# Patient Record
Sex: Male | Born: 1995 | Race: Black or African American | Hispanic: No | Marital: Single | Smoking: Never smoker
Health system: Southern US, Community
[De-identification: ages and names within clinical notes are randomized; demographics above are authoritative.]

---
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
## 2017-10-06 ENCOUNTER — Emergency Department (HOSPITAL_COMMUNITY)
Admission: EM | Admit: 2017-10-06 | Discharge: 2017-10-06 | Disposition: A | Discharge status: Home / Self Care | Payer: No Typology Code available for payment source | Attending: Emergency Medicine | Admitting: Emergency Medicine

## 2017-10-06 ENCOUNTER — Other Ambulatory Visit: Payer: Self-pay

## 2017-10-06 ENCOUNTER — Encounter (HOSPITAL_COMMUNITY): Payer: Self-pay | Admitting: Emergency Medicine

## 2017-10-06 ENCOUNTER — Emergency Department (HOSPITAL_COMMUNITY): Payer: No Typology Code available for payment source

## 2017-10-06 DIAGNOSIS — Y92411 Interstate highway as the place of occurrence of the external cause: Secondary | ICD-10-CM | POA: Insufficient documentation

## 2017-10-06 DIAGNOSIS — M542 Cervicalgia: Secondary | ICD-10-CM | POA: Insufficient documentation

## 2017-10-06 DIAGNOSIS — M25511 Pain in right shoulder: Secondary | ICD-10-CM | POA: Diagnosis not present

## 2017-10-06 DIAGNOSIS — Y9389 Activity, other specified: Secondary | ICD-10-CM | POA: Insufficient documentation

## 2017-10-06 DIAGNOSIS — Y999 Unspecified external cause status: Secondary | ICD-10-CM | POA: Diagnosis not present

## 2017-10-06 MED ORDER — CYCLOBENZAPRINE HCL 10 MG PO TABS
10.0000 mg | ORAL_TABLET | Freq: Once | ORAL | Status: AC
  Administered 2017-10-06: 10 mg via ORAL
  Filled 2017-10-06: qty 1

## 2017-10-06 MED ORDER — CYCLOBENZAPRINE HCL 10 MG PO TABS: 10.0000 mg | ORAL_TABLET | Freq: Every evening | ORAL | 0 refills | Status: DC | PRN

## 2017-10-06 MED ORDER — ACETAMINOPHEN 500 MG PO TABS
1000.0000 mg | ORAL_TABLET | Freq: Once | ORAL | Status: AC
  Administered 2017-10-06: 1000 mg via ORAL
  Filled 2017-10-06: qty 2
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
## 2017-10-06 NOTE — Discharge Instructions (Signed)
Today you received medications that may make you sleepy or impair your ability to make decisions.  For the next 24 hours please do not drive, operate heavy machinery, care for a small child with out another adult present, or perform any activities that may cause harm to you or someone else if you were to fall asleep or be impaired.  ° °You are being prescribed a medication which may make you sleepy. Please follow up of listed precautions for at least 24 hours after taking one dose. ° °Please take Ibuprofen (Advil, motrin) and Tylenol (acetaminophen) to relieve your pain.  You may take up to 600 MG (3 pills) of normal strength ibuprofen every 8 hours as needed.  In between doses of ibuprofen you make take tylenol, up to 1,000 mg (two extra strength pills).  Do not take more than 3,000 mg tylenol in a 24 hour period.  Please check all medication labels as many medications such as pain and cold medications may contain tylenol.  Do not drink alcohol while taking these medications.  Do not take other NSAID'S while taking ibuprofen (such as aleve or naproxen).  Please take ibuprofen with food to decrease stomach upset. ° °The best way to get rid of muscle pain is by taking NSAIDS, using heat, massage therapy, and gentle stretching/range of motion exercises. ° °

## 2019-01-29 ENCOUNTER — Other Ambulatory Visit: Payer: Self-pay

## 2019-01-29 ENCOUNTER — Emergency Department (HOSPITAL_COMMUNITY)
Admission: EM | Admit: 2019-01-29 | Discharge: 2019-01-30 | Disposition: A | Discharge status: Home / Self Care | Payer: BLUE CROSS/BLUE SHIELD | Attending: Emergency Medicine | Admitting: Emergency Medicine

## 2019-01-29 ENCOUNTER — Emergency Department (HOSPITAL_COMMUNITY): Payer: BLUE CROSS/BLUE SHIELD

## 2019-01-29 ENCOUNTER — Encounter (HOSPITAL_COMMUNITY): Payer: Self-pay

## 2019-01-29 DIAGNOSIS — Z79899 Other long term (current) drug therapy: Secondary | ICD-10-CM | POA: Insufficient documentation

## 2019-01-29 DIAGNOSIS — R42 Dizziness and giddiness: Secondary | ICD-10-CM | POA: Insufficient documentation

## 2019-01-29 DIAGNOSIS — R531 Weakness: Secondary | ICD-10-CM

## 2019-01-29 DIAGNOSIS — R5383 Other fatigue: Secondary | ICD-10-CM | POA: Diagnosis not present

## 2019-01-29 DIAGNOSIS — R0789 Other chest pain: Secondary | ICD-10-CM | POA: Diagnosis not present

## 2019-01-29 DIAGNOSIS — R55 Syncope and collapse: Secondary | ICD-10-CM

## 2019-01-29 LAB — URINALYSIS, ROUTINE W REFLEX MICROSCOPIC
Bilirubin Urine: NEGATIVE
GLUCOSE, UA: NEGATIVE mg/dL
HGB URINE DIPSTICK: NEGATIVE
Ketones, ur: NEGATIVE mg/dL
LEUKOCYTE UA: NEGATIVE
Nitrite: NEGATIVE
PH: 7 (ref 5.0–8.0)
PROTEIN: NEGATIVE mg/dL
SPECIFIC GRAVITY, URINE: 1.019 (ref 1.005–1.030)

## 2019-01-29 LAB — CBC
HEMATOCRIT: 47.1 % (ref 39.0–52.0)
HEMOGLOBIN: 14.8 g/dL (ref 13.0–17.0)
MCH: 28 pg (ref 26.0–34.0)
MCHC: 31.4 g/dL (ref 30.0–36.0)
MCV: 89.2 fL (ref 80.0–100.0)
NRBC: 0 % (ref 0.0–0.2)
Platelets: 271 10*3/uL (ref 150–400)
RBC: 5.28 MIL/uL (ref 4.22–5.81)
RDW: 12.6 % (ref 11.5–15.5)
WBC: 5.9 10*3/uL (ref 4.0–10.5)

## 2019-01-29 LAB — BASIC METABOLIC PANEL
ANION GAP: 9 (ref 5–15)
BUN: 13 mg/dL (ref 6–20)
CO2: 27 mmol/L (ref 22–32)
Calcium: 9.5 mg/dL (ref 8.9–10.3)
Chloride: 102 mmol/L (ref 98–111)
Creatinine, Ser: 0.94 mg/dL (ref 0.61–1.24)
Glucose, Bld: 86 mg/dL (ref 70–99)
POTASSIUM: 3.7 mmol/L (ref 3.5–5.1)
SODIUM: 138 mmol/L (ref 135–145)

## 2019-01-29 LAB — I-STAT TROPONIN, ED: Troponin i, poc: 0 ng/mL (ref 0.00–0.08)

## 2019-01-29 MED ORDER — SODIUM CHLORIDE 0.9 % IV BOLUS
1000.0000 mL | Freq: Once | INTRAVENOUS | Status: AC
  Administered 2019-01-29: 1000 mL via INTRAVENOUS

## 2019-01-29 MED ORDER — KETOROLAC TROMETHAMINE 30 MG/ML IJ SOLN
30.0000 mg | Freq: Once | INTRAMUSCULAR | Status: AC
  Administered 2019-01-29: 30 mg via INTRAVENOUS
  Filled 2019-01-29: qty 1

## 2019-01-29 MED ORDER — ONDANSETRON HCL 4 MG/2ML IJ SOLN
4.0000 mg | Freq: Once | INTRAMUSCULAR | Status: AC
  Administered 2019-01-30: 4 mg via INTRAVENOUS
  Filled 2019-01-29: qty 2

## 2019-01-29 NOTE — ED Notes (Signed)
Patient transported to X-ray 

## 2019-01-29 NOTE — ED Provider Notes (Signed)
[facility]-EMERGENCY DEPT Provider Note   CSN: [id number] Arrival date & time: 01/29/19  1825    History   Chief Complaint Chief Complaint  Patient presents with  . Dizziness  . Gastroesophageal Reflux    HPI Fred Hudson is a 23 y.o. male.     Fred Hudson is a 23 y.o. male who is otherwise healthy, presents to the emergency department for evaluation of feeling lightheaded, dizzy and fatigued.  Reports that he started feeling this way earlier this afternoon after he left work.  He reports that after returning home from work he felt lightheaded and almost passed out but was able to catch himself.  He reports feeling shaky, generally fatigued and weak.  He has not had any fevers.  Reports he has had some intermittent sharp chest pains but no associated shortness of breath.  Pain is not worse with exertion and is not pleuritic in nature.  He does report some associated issues with acid reflux and has had some intermittent burping and indigestion.  No associated fevers, cough or congestion.  No abdominal pain, nausea or vomiting, no diarrhea.  No one else at home with similar symptoms.  He has never had symptoms prior to this before.  Patient does report that all he has had to eat all day is a small cup of noodles and he has had very little to drink and wonders if this may contribute.  Patient's dad is at bedside and denies any family history of heart issues or sudden cardiac death, patient has not had any issues with arrhythmia or other cardiac abnormalities.     History reviewed. No pertinent past medical history.  There are no active problems to display for this patient.   History reviewed. No pertinent surgical history.      Home Medications    Prior to Admission medications   Medication Sig Start Date End Date Taking? Authorizing Provider  acetaminophen (TYLENOL) 500 MG tablet Take 1,000 mg by mouth every 6 (six) hours as needed for moderate pain.   Yes  [provider]  cyclobenzaprine (FLEXERIL) 10 MG tablet Take 1 tablet (10 mg total) at bedtime as needed by mouth for muscle spasms. Patient not taking: Reported on 01/29/2019 10/06/17   Cristina Gong, PA-C    Family History Family History  Family history unknown: Yes    Social History Social History   Tobacco Use  . Smoking status: Never Smoker  . Smokeless tobacco: Never Used  Substance Use Topics  . Alcohol use: No    Frequency: Never  . Drug use: No     Allergies   Patient has no known allergies.   Review of Systems Review of Systems  Constitutional: Positive for fatigue. Negative for chills and fever.  HENT: Negative.   Eyes: Negative for visual disturbance.  Respiratory: Negative for cough and shortness of breath.   Cardiovascular: Positive for chest pain. Negative for palpitations and leg swelling.  Gastrointestinal: Negative for abdominal pain, blood in stool, constipation, diarrhea, nausea and vomiting.  Genitourinary: Negative for dysuria and frequency.  Musculoskeletal: Negative for arthralgias and myalgias.  Skin: Negative for color change and rash.  Neurological: Positive for dizziness and light-headedness. Negative for syncope, speech difficulty, weakness, numbness and headaches.     Physical Exam Updated Vital Signs BP (!) 125/92   Pulse (!) 56   Temp 98.4 F (36.9 C) (Oral)   Resp 19   Ht  (1.727 m)   Wt 78.6  kg   SpO2 96%   BMI 26.34 kg/m   Physical Exam Vitals signs and nursing note reviewed.  Constitutional:      General: He is not in acute distress.    Appearance: Normal appearance. He is well-developed and normal weight. He is not ill-appearing, toxic-appearing or diaphoretic.  HENT:     Head: Normocephalic and atraumatic.     Mouth/Throat:     Mouth: Mucous membranes are moist.     Pharynx: Oropharynx is clear.  Eyes:     General:        Right eye: No discharge.        Left eye: No discharge.     Pupils:  Pupils are equal, round, and reactive to light.  Neck:     Musculoskeletal: Neck supple.  Cardiovascular:     Rate and Rhythm: Normal rate and regular rhythm.     Pulses: Normal pulses.     Heart sounds: Normal heart sounds. No murmur. No friction rub. No gallop.   Pulmonary:     Effort: Pulmonary effort is normal. No respiratory distress.     Breath sounds: Normal breath sounds. No wheezing or rales.     Comments: Respirations equal and unlabored, patient able to speak in full sentences, lungs clear to auscultation bilaterally Abdominal:     General: Bowel sounds are normal. There is no distension.     Palpations: Abdomen is soft. There is no mass.     Tenderness: There is no abdominal tenderness. There is no guarding.     Comments: Abdomen soft, nondistended, nontender to palpation in all quadrants without guarding or peritoneal signs  Musculoskeletal:        General: No deformity.     Right lower leg: No edema.     Left lower leg: No edema.  Skin:    General: Skin is warm and dry.     Capillary Refill: Capillary refill takes less than 2 seconds.  Neurological:     Mental Status: He is alert.     Coordination: Coordination normal.     Comments: Speech is clear, able to follow commands CN III-XII intact Normal strength in upper and lower extremities bilaterally including dorsiflexion and plantar flexion, strong and equal grip strength Sensation normal to light and sharp touch Moves extremities without ataxia, coordination intact   Psychiatric:        Mood and Affect: Mood normal.        Behavior: Behavior normal.      ED Treatments / Results  Labs (all labs ordered are listed, but only abnormal results are displayed) Labs Reviewed  BASIC METABOLIC PANEL  CBC  URINALYSIS, ROUTINE W REFLEX MICROSCOPIC  I-STAT TROPONIN, ED    EKG EKG Interpretation  Date/Time:  Saturday January 29 2019 19:27:09 EST Ventricular Rate:  64 PR Interval:    QRS Duration: 80 QT  Interval:  371 QTC Calculation: 383 R Axis:   81 Text Interpretation:  Sinus rhythm Nonspecific T abnrm, anterolateral leads ST elev, probable normal early repol pattern Baseline wander in lead(s) I II aVR agree.  no old comparison. Confirmed by Arby Barrette [phone]) on 01/29/2019 10:41:45 PM   Radiology Dg Chest 2 View  Result Date: 01/29/2019 CLINICAL DATA:  Burping and acid reflux. EXAM: CHEST - 2 VIEW COMPARISON:  October 06, 2017 FINDINGS: The heart size and mediastinal contours are within normal limits. Both lungs are clear. The visualized skeletal structures are unremarkable. IMPRESSION: No active cardiopulmonary disease. Electronically Signed  By: Gerome Sam III M.D   On: 01/29/2019 22:55    Procedures Procedures (including critical care time)  Medications Ordered in ED Medications  sodium chloride 0.9 % bolus 1,000 mL (0 mLs Intravenous Stopped 01/30/19 0006)  ketorolac (TORADOL) 30 MG/ML injection 30 mg (30 mg Intravenous Given 01/29/19 2328)  ondansetron (ZOFRAN) injection 4 mg (4 mg Intravenous Given 01/30/19 0011)     Initial Impression / Assessment and Plan / ED Course  I have reviewed the triage vital signs and the nursing notes.  Pertinent labs & imaging results that were available during my care of the patient were reviewed by me and considered in my medical decision making (see chart for details).  Patient presents with multiple nonspecific symptoms morning he feels "shaky weak and tired.  Symptoms started this afternoon after returning home from work.  No associated fevers.  Vitals normal on arrival.  Reports he is felt lightheaded and had a presyncopal episode at home today.  No associated shortness of breath patient has reported some intermittent sharp chest pains although I think these may be related to patient's GERD symptoms he has been complaining of with belching and indigestion.  No prior cardiac history or significant cardiac family history.  Heart RRR and  lungs clear, abdominal exam benign.  Will check basic labs, urinalysis, troponin EKG and chest x-ray.  Will give 1 L IV fluids and Toradol.  Patient is not having any focal infectious symptoms, I suspect patient's symptoms may be due to the fact that he has not had much to eat or drink at all today.  Low suspicion for cardiac etiology.  EKG shows normal sinus rhythm without concerning changes.  Negative troponin.  No leukocytosis, normal hemoglobin, no acute electrolyte derangements and normal renal function, urinalysis without signs of infection or significant dehydration.  Chest x-ray shows no active cardiopulmonary disease.  Patient reports he is feeling a bit nauseated.  Provided nausea medication and patient was given a meal and something to drink.  Afterwards he was able to ambulate in the department without any dizziness or syncopal symptoms.  I have encouraged patient to eat and drink frequent meals, also discussed with him that he may have a mild viral syndrome.  I have encouraged PCP follow-up and given appropriate return precautions.  Patient expresses understanding and agreement with plan.  He is stable for discharge home at this time.  Final Clinical Impressions(s) / ED Diagnoses   Final diagnoses:  Near syncope  Weakness  Atypical chest pain    ED Discharge Orders    None       Dartha Lodge, [place] 02/03/19 1742    Arby Barrette, MD 02/05/19 1317

## 2019-01-29 NOTE — ED Triage Notes (Addendum)
Patient states he has been "burping and having acid reflux." patient states he was taking acid reflux medicine OTC, but has not had any in a year. Patient states he was "coming to the ED for the burping" and then he began having chest pain. Patient also c/o dizziness when he was jogging today then he got in the car. Patient denies any SOB  Patient has multiple complaints.

## 2019-01-30 NOTE — Discharge Instructions (Signed)
Your work-up today is very reassuring.  Please follow-up with your primary care doctor regarding the symptoms you are having today.  Make sure you are eating regular meals and drinking plenty of water.  Return to the emergency department if you pass out, have worsening chest pain or shortness of breath, fevers, abdominal pain or any other new or concerning symptoms.

## 2019-01-30 NOTE — ED Notes (Signed)
Pt. Ambulated in hallway with steady gait, denied pain but complaint of lightheadedness . O2 % SAT  >95%. PA aware.

## 2019-05-26 ENCOUNTER — Other Ambulatory Visit: Payer: Self-pay

## 2019-05-26 ENCOUNTER — Ambulatory Visit (HOSPITAL_COMMUNITY)
Admission: EM | Admit: 2019-05-26 | Discharge: 2019-05-26 | Disposition: A | Discharge status: Home / Self Care | Payer: BLUE CROSS/BLUE SHIELD | Attending: Family Medicine | Admitting: Family Medicine

## 2019-05-26 ENCOUNTER — Encounter (HOSPITAL_COMMUNITY): Payer: Self-pay | Admitting: *Deleted

## 2019-05-26 DIAGNOSIS — Z202 Contact with and (suspected) exposure to infections with a predominantly sexual mode of transmission: Secondary | ICD-10-CM | POA: Insufficient documentation

## 2019-05-26 MED ORDER — AZITHROMYCIN 250 MG PO TABS
1000.0000 mg | ORAL_TABLET | Freq: Once | ORAL | Status: AC
  Administered 2019-05-26: 1000 mg via ORAL

## 2019-05-26 MED ORDER — AZITHROMYCIN 250 MG PO TABS
ORAL_TABLET | ORAL | Status: AC
  Filled 2019-05-26: qty 4

## 2019-05-26 MED ORDER — CEFTRIAXONE SODIUM 250 MG IJ SOLR
250.0000 mg | Freq: Once | INTRAMUSCULAR | Status: AC
  Administered 2019-05-26: 250 mg via INTRAMUSCULAR

## 2019-05-26 MED ORDER — LIDOCAINE HCL (PF) 1 % IJ SOLN
INTRAMUSCULAR | Status: AC
  Filled 2019-05-26: qty 2

## 2019-05-26 MED ORDER — CEFTRIAXONE SODIUM 250 MG IJ SOLR
INTRAMUSCULAR | Status: AC
  Filled 2019-05-26: qty 250

## 2019-05-26 NOTE — ED Provider Notes (Signed)
[facility]    CSN: [id number] Arrival date & time: 05/26/19  1847      History   Chief Complaint Chief Complaint  Patient presents with  . Exposure to STD    HPI Fred Hudson is a 23 y.o. male.   HPI  Patient is asymptomatic.  He received a phone call from his sexual partner that she tested positive for gonorrhea.  He is here for testing and treatment. We discussed safe sex and the importance of using condoms to prevent pregnancy and sexually transmitted disease  History reviewed. No pertinent past medical history.  There are no active problems to display for this patient.   History reviewed. No pertinent surgical history.     Home Medications    Prior to Admission medications   Medication Sig Start Date End Date Taking? Authorizing Provider  acetaminophen (TYLENOL) 500 MG tablet Take 1,000 mg by mouth every 6 (six) hours as needed for moderate pain.    [provider]    Family History Family History  Problem Relation Age of Onset  . Healthy Mother   . Healthy Father     Social History Social History   Tobacco Use  . Smoking status: Never Smoker  . Smokeless tobacco: Never Used  Substance Use Topics  . Alcohol use: Not Currently    Frequency: Never  . Drug use: No     Allergies   Patient has no known allergies.   Review of Systems Review of Systems  Constitutional: Negative for chills and fever.  HENT: Negative for ear pain and sore throat.   Eyes: Negative for pain and visual disturbance.  Respiratory: Negative for cough and shortness of breath.   Cardiovascular: Negative for chest pain and palpitations.  Gastrointestinal: Negative for abdominal pain and vomiting.  Genitourinary: Negative for dysuria and hematuria.  Musculoskeletal: Negative for arthralgias and back pain.  Skin: Negative for color change and rash.  Neurological: Negative for seizures and syncope.  All other systems reviewed and are negative.     Physical Exam Triage Vital Signs ED Triage Vitals [05/26/19 2000]  Enc Vitals Group     BP 129/75     Pulse Rate 67     Resp 16     Temp 98.2 F (36.8 C)     Temp Source Oral     SpO2 100 %     Weight      Height      Head Circumference      Peak Flow      Pain Score 0     Pain Loc      Pain Edu?      Excl. in GC?    No data found.  Updated Vital Signs BP 129/75   Pulse 67   Temp 98.2 F (36.8 C) (Oral)   Resp 16   SpO2 100%   Visual Acuity Right Eye Distance:   Left Eye Distance:   Bilateral Distance:    Right Eye Near:   Left Eye Near:    Bilateral Near:     Physical Exam Constitutional:      General: He is not in acute distress.    Appearance: He is well-developed.  HENT:     Head: Normocephalic and atraumatic.  Eyes:     Conjunctiva/sclera: Conjunctivae normal.     Pupils: Pupils are equal, round, and reactive to light.  Neck:     Musculoskeletal: Normal range of motion.  Cardiovascular:  Rate and Rhythm: Normal rate.  Pulmonary:     Effort: Pulmonary effort is normal. No respiratory distress.  Abdominal:     General: There is no distension.     Palpations: Abdomen is soft.  Genitourinary:    Comments: Deferred, asymptomatic Musculoskeletal: Normal range of motion.  Skin:    General: Skin is warm and dry.  Neurological:     Mental Status: He is alert.  Psychiatric:        Mood and Affect: Mood normal.        Behavior: Behavior normal.      UC Treatments / Results  Labs (all labs ordered are listed, but only abnormal results are displayed) Labs Reviewed  [place]    EKG   Radiology No results found.  Procedures Procedures (including critical care time)  Medications Ordered in UC Medications  cefTRIAXone (ROCEPHIN) injection 250 mg (has no administration in time range)  azithromycin (ZITHROMAX) tablet 1,000 mg (has no administration in time range)    Initial Impression / Assessment and Plan / UC  Course  I have reviewed the triage vital signs and the nursing notes.  Pertinent labs & imaging results that were available during my care of the patient were reviewed by me and considered in my medical decision making (see chart for details).      Final Clinical Impressions(s) / UC Diagnoses   Final diagnoses:  STD exposure     Discharge Instructions     We have treated you for chlamydia and gonorrhea today You will be called if any of your tests are positive Avoid sexual relations for 7 days after treatment to be sure all the infection is gone   ED Prescriptions    None     Controlled Substance Prescriptions [place] Controlled Substance Registry consulted? Not Applicable   Raylene Everts, MD 05/26/19 2014

## 2019-05-26 NOTE — Discharge Instructions (Addendum)
We have treated you for chlamydia and gonorrhea today You will be called if any of your tests are positive Avoid sexual relations for 7 days after treatment to be sure all the infection is gone

## 2019-05-26 NOTE — ED Triage Notes (Signed)
Denies sxs.  States partner tested positive for gonorrhea.

## 2019-05-31 LAB — URINE CYTOLOGY ANCILLARY ONLY
Chlamydia: NEGATIVE
Neisseria Gonorrhea: NEGATIVE
Trichomonas: NEGATIVE

## 2020-01-21 IMAGING — CR DG CHEST 2V
2 series · 2 of 2 positions shown · non-contrast
Comparison: October 06, 2017

CLINICAL DATA: Burping and acid reflux.

EXAM:
CHEST - 2 VIEW

[w chest pa]
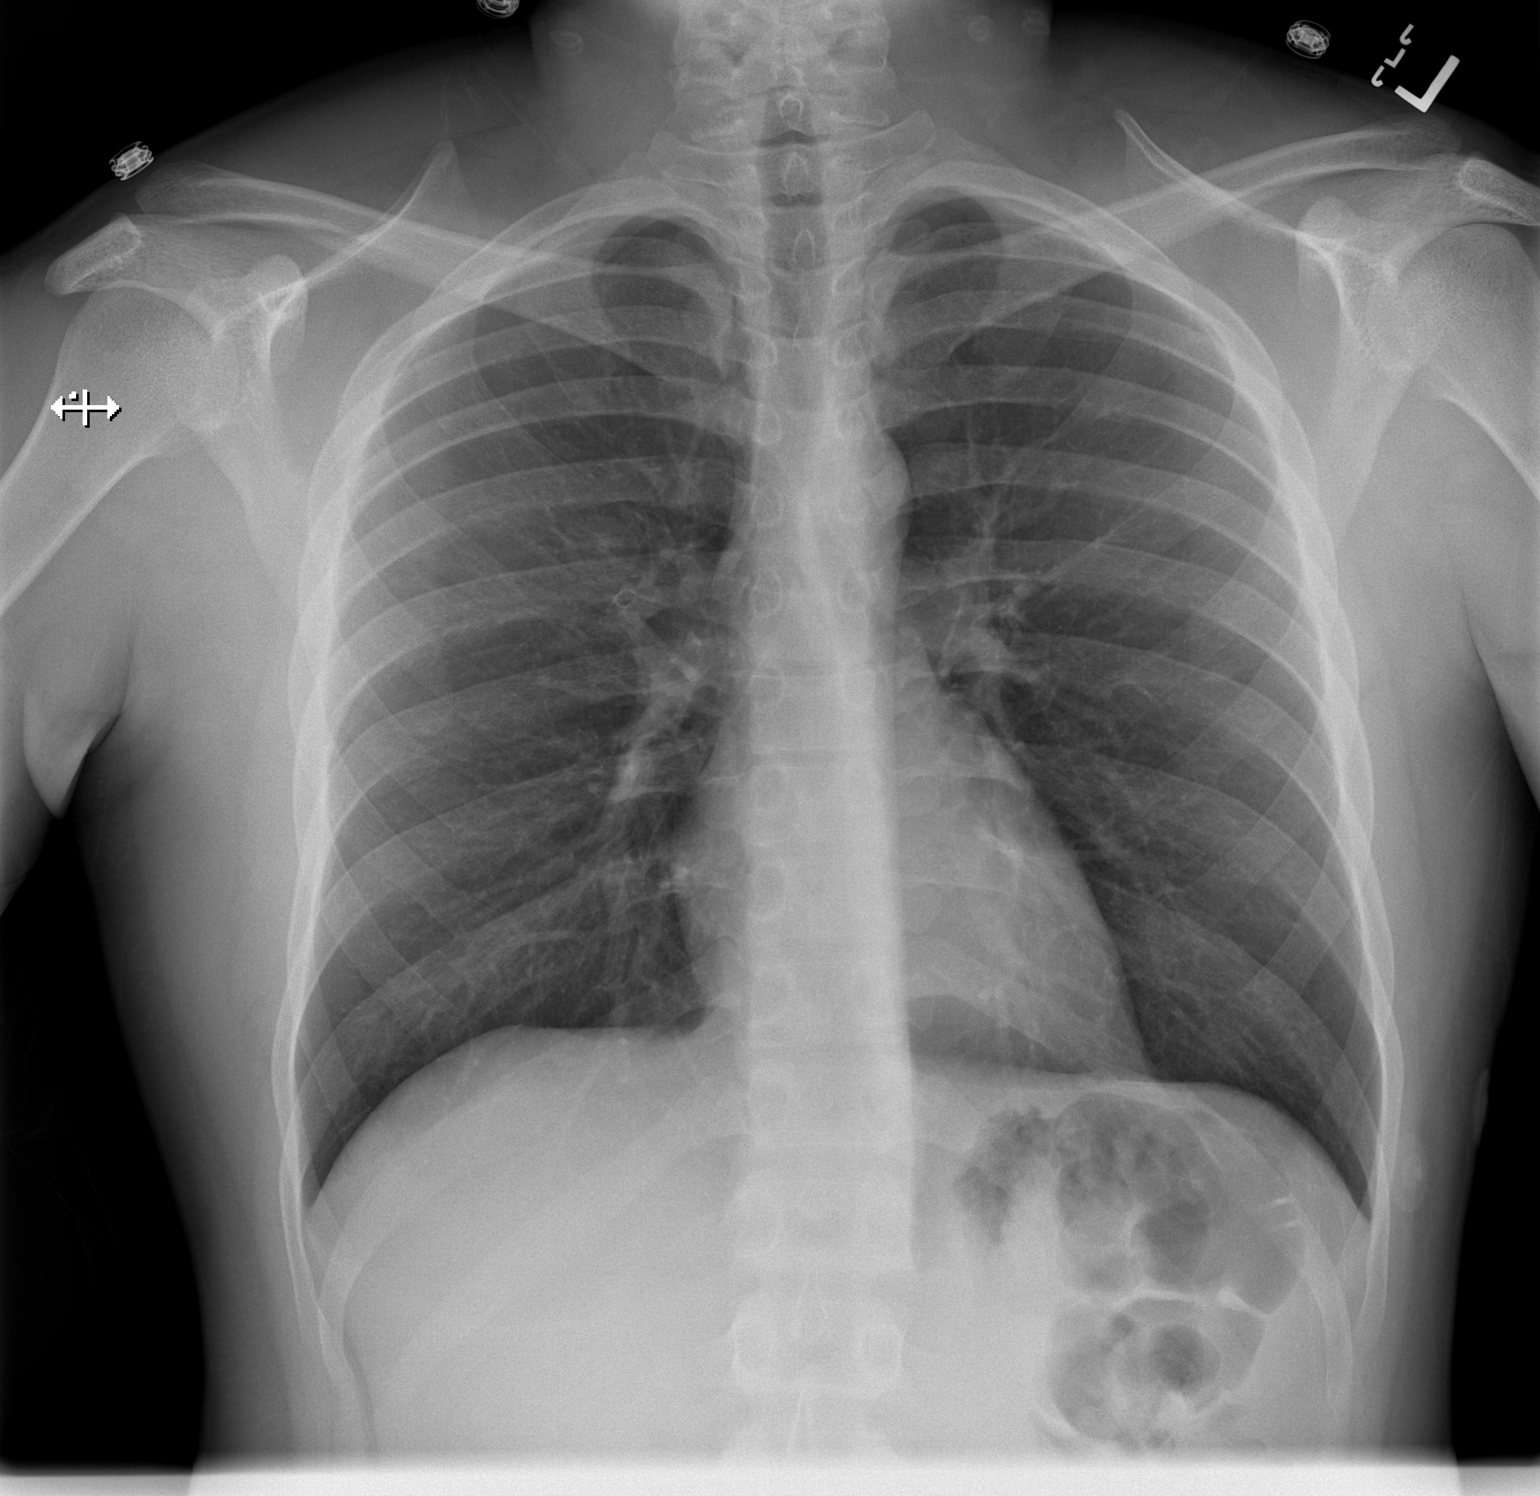

[w chest lat]
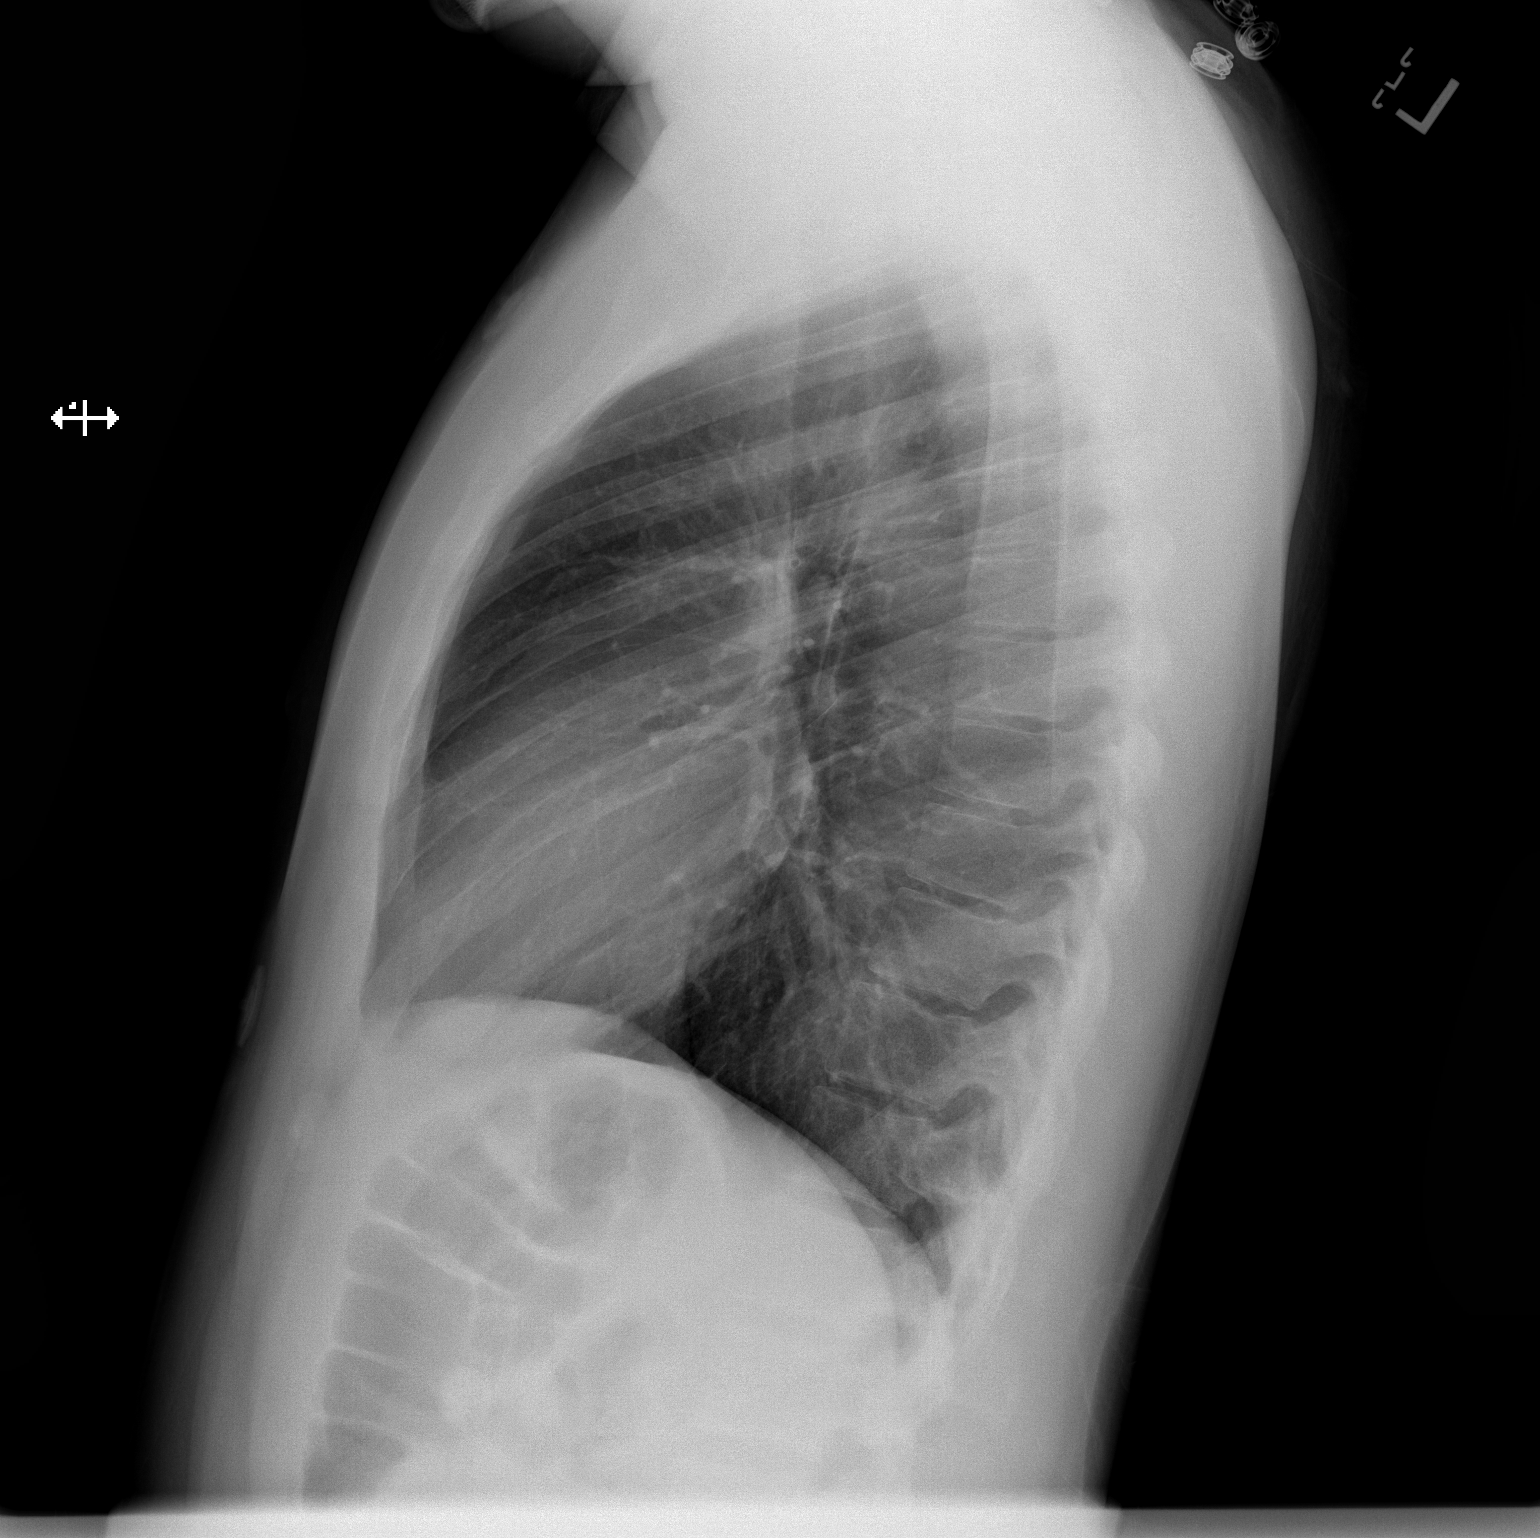

[2 of 2 positions shown; findings below may reference images not displayed]

FINDINGS: The heart size and mediastinal contours are within normal limits.
Both lungs are clear. The visualized skeletal structures are
unremarkable.
IMPRESSION: No active cardiopulmonary disease.

## 2020-03-26 ENCOUNTER — Ambulatory Visit: Payer: Self-pay | Attending: Internal Medicine

## 2020-03-26 DIAGNOSIS — Z23 Encounter for immunization: Secondary | ICD-10-CM

## 2020-03-26 NOTE — Progress Notes (Signed)
   Covid-19 Vaccination Clinic  Name:  [name]    MRN: [id number] DOB: March 22, 1996  03/26/2020  Mr. Cockerell was observed post Covid-19 immunization for 15 minutes without incident. He was provided with Vaccine Information Sheet and instruction to access the V-Safe system.   Mr. Bernier was instructed to call 911 with any severe reactions post vaccine: Marland Kitchen Difficulty breathing  . Swelling of face and throat  . A fast heartbeat  . A bad rash all over body  . Dizziness and weakness   Immunizations Administered    Name Date Dose VIS Date Route   Pfizer COVID-19 Vaccine 03/26/2020 12:51 PM 0.3 mL 01/18/2019 Intramuscular   Manufacturer: [employer], [employer]   Lot: [id number]   NDC: [address]-3

## 2020-04-16 ENCOUNTER — Ambulatory Visit: Payer: Self-pay | Attending: Internal Medicine

## 2020-04-16 DIAGNOSIS — Z23 Encounter for immunization: Secondary | ICD-10-CM

## 2020-04-16 NOTE — Progress Notes (Signed)
   Covid-19 Vaccination Clinic  Name:  Fred Hudson    MRN: [id number] DOB: [birth year]  04/16/2020  Mr. Gentles was observed post Covid-19 immunization for 15 minutes without incident. He was provided with Vaccine Information Sheet and instruction to access the V-Safe system.   Mr. Leask was instructed to call 911 with any severe reactions post vaccine: Marland Kitchen Difficulty breathing  . Swelling of face and throat  . A fast heartbeat  . A bad rash all over body  . Dizziness and weakness   Immunizations Administered    Name Date Dose VIS Date Route   Pfizer COVID-19 Vaccine 04/16/2020 10:13 AM 0.3 mL 01/18/2019 Intramuscular   Manufacturer: [employer], [employer]   Lot: [id number]   NDC: [address]-7
